# Patient Record
Sex: Male | Born: 1990 | Race: Black or African American | Hispanic: No | Marital: Single | Smoking: Never smoker
Health system: Southern US, Community
[De-identification: ages and names within clinical notes are randomized; demographics above are authoritative.]

---

## 2009-07-17 ENCOUNTER — Emergency Department (HOSPITAL_COMMUNITY): Admission: EM | Admit: 2009-07-17 | Discharge: 2009-07-17 | Payer: Self-pay | Admitting: Emergency Medicine

## 2009-11-10 ENCOUNTER — Emergency Department (HOSPITAL_COMMUNITY)
Admission: EM | Admit: 2009-11-10 | Discharge: 2009-11-10 | Payer: Self-pay | Source: Home / Self Care | Admitting: Emergency Medicine

## 2012-06-15 ENCOUNTER — Emergency Department (HOSPITAL_COMMUNITY)
Admission: EM | Admit: 2012-06-15 | Discharge: 2012-06-15 | Disposition: A | Discharge status: Home / Self Care | Payer: BC Managed Care – PPO | Attending: Emergency Medicine | Admitting: Emergency Medicine

## 2012-06-15 ENCOUNTER — Encounter (HOSPITAL_COMMUNITY): Payer: Self-pay | Admitting: Emergency Medicine

## 2012-06-15 DIAGNOSIS — R51 Headache: Secondary | ICD-10-CM | POA: Insufficient documentation

## 2012-06-15 DIAGNOSIS — R5383 Other fatigue: Secondary | ICD-10-CM | POA: Insufficient documentation

## 2012-06-15 DIAGNOSIS — B9789 Other viral agents as the cause of diseases classified elsewhere: Secondary | ICD-10-CM

## 2012-06-15 DIAGNOSIS — R110 Nausea: Secondary | ICD-10-CM | POA: Insufficient documentation

## 2012-06-15 DIAGNOSIS — IMO0001 Reserved for inherently not codable concepts without codable children: Secondary | ICD-10-CM | POA: Insufficient documentation

## 2012-06-15 DIAGNOSIS — R61 Generalized hyperhidrosis: Secondary | ICD-10-CM | POA: Insufficient documentation

## 2012-06-15 DIAGNOSIS — R509 Fever, unspecified: Secondary | ICD-10-CM | POA: Insufficient documentation

## 2012-06-15 DIAGNOSIS — R5381 Other malaise: Secondary | ICD-10-CM | POA: Insufficient documentation

## 2012-06-15 DIAGNOSIS — J029 Acute pharyngitis, unspecified: Secondary | ICD-10-CM | POA: Insufficient documentation

## 2012-06-15 DIAGNOSIS — J028 Acute pharyngitis due to other specified organisms: Secondary | ICD-10-CM

## 2012-06-15 DIAGNOSIS — R6889 Other general symptoms and signs: Secondary | ICD-10-CM

## 2012-06-15 LAB — RAPID STREP SCREEN (MED CTR MEBANE ONLY): Streptococcus, Group A Screen (Direct): NEGATIVE

## 2012-06-15 MED ORDER — ACETAMINOPHEN 500 MG PO TABS: 500.0000 mg | ORAL_TABLET | Freq: Four times a day (QID) | ORAL | Status: AC | PRN

## 2012-06-15 NOTE — ED Notes (Addendum)
C/O sore throat, fever, chills, headache--onset yesterday. Has taken Tylenol this am. Pt is diaphoretic.

## 2012-06-15 NOTE — ED Notes (Signed)
Pt c/o flu like sx with fever and chills starting yesterday; pt sts sore throat also

## 2012-06-15 NOTE — ED Provider Notes (Signed)
History     CSN: [id number]  Arrival date & time 06/15/12  7829   First MD Initiated Contact with Patient 06/15/12 [phone]      Chief Complaint  Patient presents with  . Influenza  . Generalized Body Aches    (Consider location/radiation/quality/duration/timing/severity/associated sxs/prior treatment) Patient is a 22 y.o. male presenting with flu symptoms. The history is provided by the patient. No language interpreter was used.  Influenza Presenting symptoms: fatigue, fever, headache, myalgias, nausea and sore throat   Presenting symptoms: no cough, no diarrhea, no rhinorrhea and no shortness of breath   Pt is a 21yo male presenting today with flu like symptoms c/o fatigue, fever, headache, sore throat, generalized body ache and nausea. Symptoms started yesterday.  Pt has tried Tylenol this morning.  Pt sore throat causes moderate pain with swallowing solids but able to keep down fluids.  Denies difficulty breathing.  Feels nauseous but denies vomiting or diarrhea.  Denies cough. No hx of asthma or immunocompromise.    History reviewed. No pertinent past medical history.  History reviewed. No pertinent past surgical history.  History reviewed. No pertinent family history.  History  Substance Use Topics  . Smoking status: Never Smoker   . Smokeless tobacco: Not on file  . Alcohol Use: Yes      Review of Systems  Constitutional: Positive for fever and fatigue.  HENT: Positive for sore throat. Negative for rhinorrhea.   Respiratory: Negative for cough and shortness of breath.   Gastrointestinal: Positive for nausea. Negative for diarrhea.  Musculoskeletal: Positive for myalgias.  Neurological: Positive for headaches.    Allergies  Review of patient's allergies indicates no known allergies.  Home Medications   Current Outpatient Rx  Name  Route  Sig  Dispense  Refill  . ibuprofen (ADVIL,MOTRIN) 200 MG tablet   Oral   Take 400 mg by mouth every 6 (six) hours as needed  for pain or headache.         Marland Kitchen acetaminophen (TYLENOL) 500 MG tablet   Oral   Take 1 tablet (500 mg total) by mouth every 6 (six) hours as needed for pain.   30 tablet   0     BP 144/76  Pulse 96  Temp(Src) 99.3 F (37.4 C) (Oral)  Resp 20  Ht 6\' 1"  (1.854 m)  Wt 230 lb (104.327 kg)  BMI 30.35 kg/m2  SpO2 97%  Physical Exam  Nursing note and vitals reviewed. Constitutional: He appears well-developed and well-nourished. No distress.  Pt lying on exam bed with face mask.  Pt feels warm to the touch.    HENT:  Head: Normocephalic and atraumatic. No trismus in the jaw.  Mouth/Throat: Uvula is midline and mucous membranes are normal. He does not have dentures. No oral lesions. Normal dentition. No dental abscesses, edematous, lacerations or dental caries. Oropharyngeal exudate, posterior oropharyngeal edema and posterior oropharyngeal erythema present. No tonsillar abscesses.  Eyes: Conjunctivae are normal.  Neck: Normal range of motion. Neck supple. No JVD present. No tracheal deviation present. No thyromegaly present.  Cardiovascular: Normal rate, regular rhythm and normal heart sounds.   Pulmonary/Chest: Effort normal and breath sounds normal. No stridor. No respiratory distress. He has no wheezes. He has no rales. He exhibits no tenderness.  Abdominal: Soft. Bowel sounds are normal. He exhibits no distension and no mass. There is no tenderness. There is no rebound and no guarding.  Musculoskeletal: Normal range of motion. He exhibits no edema and no tenderness.  Lymphadenopathy:  He has no cervical adenopathy.  Neurological: He is alert.  Skin: Skin is warm. No rash noted. He is diaphoretic. No erythema. No pallor.  Psychiatric: He has a normal mood and affect. His behavior is normal. Judgment and thought content normal.    ED Course  Procedures (including critical care time)  Labs Reviewed  RAPID STREP SCREEN   No results found.   1. Flu-like symptoms   2. Sore  throat (viral)       MDM  Pt c/o 1 day hx of flu like symptoms with sore throat, body aches, fever, chills, and nausea.  Able to keep down fluids.  No difficulty swallowing or breathing.  HENT: bilateral tonsillar erythema and hypertrophy with exudates but no peritonsillar abscess.   Rapid strep: neg.  Believe this is viral pharyngitis, possible flu.  Will tx symptomatically with acetaminophen.  Have pt rest and drink lots of fluids.  Will give work note.  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  Tamiflu is more beneficial for young or older immunocompromised pt's and may only help relieve sxs 1 day sooner but could cause added unwanted side effects.  Will have pt f/u with PCP or urgent care in a week if symptoms do not improve, or sooner if symptoms worsen.  Return to ED if trouble breathing or unable to keep down fluids.   Vitals: unremarkable. Discharged in stable condition.    Discussed pt with attending during ED encounter.       Junius Finner, PA-C 06/15/12 2149

## 2012-06-17 NOTE — ED Provider Notes (Signed)
Medical screening examination/treatment/procedure(s) were performed by non-physician practitioner and as supervising physician I was immediately available for consultation/collaboration.   Richardean Canal, MD 06/17/12 [phone]

## 2019-03-04 ENCOUNTER — Ambulatory Visit (HOSPITAL_COMMUNITY)
Admission: EM | Admit: 2019-03-04 | Discharge: 2019-03-04 | Disposition: A | Discharge status: Home / Self Care | Attending: Emergency Medicine | Admitting: Emergency Medicine

## 2019-03-04 ENCOUNTER — Other Ambulatory Visit: Payer: Self-pay

## 2019-03-04 ENCOUNTER — Encounter (HOSPITAL_COMMUNITY): Payer: Self-pay

## 2019-03-04 DIAGNOSIS — M545 Low back pain, unspecified: Secondary | ICD-10-CM

## 2019-03-04 MED ORDER — CYCLOBENZAPRINE HCL 5 MG PO TABS: 5.0000 mg | ORAL_TABLET | Freq: Two times a day (BID) | ORAL | 0 refills | Status: DC | PRN

## 2019-03-04 MED ORDER — PREDNISONE 10 MG (21) PO TBPK
ORAL_TABLET | Freq: Every day | ORAL | 0 refills | Status: AC
Start: 2019-03-04 — End: 2019-03-10

## 2019-03-04 NOTE — Discharge Instructions (Signed)
Back pain is most likely muscular straining Begin prednisone taper over the next 6 days, begin with 6 tablets on day 1, decrease by 1 tablet each day until completing.  Take with food and in the morning if you are able.  You may use flexeril as needed to help with pain. This is a muscle relaxer and causes sedation- please use only at bedtime or when you will be home and not have to drive/work  Alternate ice and heat to back Gentle stretching  Please follow-up if pain not improving, worsening, developing leg weakness, numbness or tingling, blood in urine or issues with urination/bowels

## 2019-03-04 NOTE — ED Triage Notes (Signed)
Pt states he has left side back pain. Pt the pain is a 10. Pt state this has been going on 3 days. Pt states he has tried Ibuprofen.

## 2019-03-06 NOTE — ED Provider Notes (Signed)
[facility]    CSN: [id number] Arrival date & time: 03/04/19  1354      History   Chief Complaint Chief Complaint  Patient presents with  . Back Pain    HPI Brian Brooks is a 29 y.o. male no significant past medical history presenting today for evaluation of back pain.  Patient states that for the past 3 days he has had pain in his left lower back.  States that on Monday night he sneezed and believes this is what triggered his symptoms.  He denies specific trauma or blow to his back.  Pain worsens with bending and sitting; has difficulty getting comfortable.  He has been taking Tylenol and ibuprofen without for relief.  He initially thought his symptoms were improving, but worsened again today.  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  He denies any changes in urination or bowel movements.  Denies dysuria.  Denies hematuria.  Denies history of prior kidney stones.  Denies any abdominal pain or any pain that radiates to his testicle.  Denies testicular swelling.  He denies prior history of any back pain.  HPI  History reviewed. No pertinent past medical history.  There are no problems to display for this patient.   History reviewed. No pertinent surgical history.     Home Medications    Prior to Admission medications   Medication Sig Start Date End Date Taking? Authorizing Provider  acetaminophen (TYLENOL) 500 MG tablet Take 1 tablet (500 mg total) by mouth every 6 (six) hours as needed for pain. 06/15/12   Lurene Shadow, PA-C  cyclobenzaprine (FLEXERIL) 5 MG tablet Take 1-2 tablets (5-10 mg total) by mouth 2 (two) times daily as needed for muscle spasms. 03/04/19   Lynanne Delgreco C, PA-C  ibuprofen (ADVIL,MOTRIN) 200 MG tablet Take 400 mg by mouth every 6 (six) hours as needed for pain or headache.    [provider]  predniSONE (STERAPRED UNI-PAK 21 TAB) 10 MG (21) TBPK tablet Take by mouth daily  for 6 days. Take as directed on package 03/04/19 03/10/19  Lew Dawes, PA-C    Family History History reviewed. No pertinent family history.  Social History Social History   Tobacco Use  . Smoking status: Never Smoker  . Smokeless tobacco: Never Used  Substance Use Topics  . Alcohol use: Yes  . Drug use: No     Allergies   Patient has no known allergies.   Review of Systems Review of Systems  Constitutional: Negative for fatigue and fever.  Eyes: Negative for redness, itching and visual disturbance.  Respiratory: Negative for shortness of breath.   Cardiovascular: Negative for chest pain and leg swelling.  Gastrointestinal: Negative for nausea and vomiting.  Genitourinary: Negative for decreased urine volume, difficulty urinating, dysuria, frequency and hematuria.  Musculoskeletal: Positive for back pain and myalgias. Negative for arthralgias.  Skin: Negative for color change, rash and wound.  Neurological: Negative for dizziness, syncope, weakness, light-headedness and headaches.     Physical Exam Triage Vital Signs ED Triage Vitals  Enc Vitals Group     BP 03/04/19 1446 (!) 144/72     Pulse Rate 03/04/19 1446 68     Resp 03/04/19 1446 18     Temp 03/04/19 1446 98.3 F (36.8 C)     Temp Source 03/04/19 1446 Oral     SpO2 03/04/19 1446 100 %     Weight  03/04/19 1444 260 lb (117.9 kg)     Height --      Head Circumference --      Peak Flow --      Pain Score 03/04/19 1444 10     Pain Loc --      Pain Edu? --      Excl. in [place]? --    No data found.  Updated Vital Signs BP (!) 144/72 (BP Location: Right Arm)   Pulse 68   Temp 98.3 F (36.8 C) (Oral)   Resp 18   Wt 260 lb (117.9 kg)   SpO2 100%   BMI 34.30 kg/m   Visual Acuity Right Eye Distance:   Left Eye Distance:   Bilateral Distance:    Right Eye Near:   Left Eye Near:    Bilateral Near:     Physical Exam Vitals and nursing note reviewed.  Constitutional:      Appearance: He is  well-developed.     Comments: No acute distress but does appear uncomfortable as he is changing positions frequently  HENT:     Head: Normocephalic and atraumatic.     Nose: Nose normal.  Eyes:     Conjunctiva/sclera: Conjunctivae normal.  Cardiovascular:     Rate and Rhythm: Normal rate.  Pulmonary:     Effort: Pulmonary effort is normal. No respiratory distress.  Abdominal:     General: There is no distension.  Musculoskeletal:        General: Normal range of motion.     Cervical back: Neck supple.     Comments: Nontender to palpation of cervical, thoracic or lumbar spine midline, tenderness to palpation of left superior lumbar area and delivered thoracic area  Strength 5/5 and equal bilaterally at hips and knees, patellar reflex 2+ bilaterally  Gait without abnormality  Skin:    General: Skin is warm and dry.  Neurological:     Mental Status: He is alert and oriented to person, place, and time.      UC Treatments / Results  Labs (all labs ordered are listed, but only abnormal results are displayed) Labs Reviewed - No data to display  EKG   Radiology No results found.  Procedures Procedures (including critical care time)  Medications Ordered in UC Medications - No data to display  Initial Impression / Assessment and Plan / UC Course  I have reviewed the triage vital signs and the nursing notes.  Pertinent labs & imaging results that were available during my care of the patient were reviewed by me and considered in my medical decision making (see chart for details).     Back pain most likely lumbar strain, no radicular distribution.  No urinary symptoms and pain triggered by movement, do not suspect kidney stone.  No mechanism of injury, do not suspect acute bony abnormality.  At this time recommending continuing anti-inflammatories and will add in muscle relaxers.  Will provide steroids as alternative to NSAIDs as he has not had much improvement with this.  Will  provide prednisone taper and Flexeril.  Gentle back stretching.  No red flags for cauda equina.  Continue to monitor,Discussed strict return precautions. Patient verbalized understanding and is agreeable with plan.  Final Clinical Impressions(s) / UC Diagnoses   Final diagnoses:  Acute left-sided low back pain without sciatica     Discharge Instructions     Back pain is most likely muscular straining Begin prednisone taper over the next 6 days, begin with 6 tablets on  day 1, decrease by 1 tablet each day until completing.  Take with food and in the morning if you are able.  You may use flexeril as needed to help with pain. This is a muscle relaxer and causes sedation- please use only at bedtime or when you will be home and not have to drive/work  Alternate ice and heat to back Gentle stretching  Please follow-up if pain not improving, worsening, developing leg weakness, numbness or tingling, blood in urine or issues with urination/bowels   ED Prescriptions    Medication Sig Dispense Auth. Provider   predniSONE (STERAPRED UNI-PAK 21 TAB) 10 MG (21) TBPK tablet Take by mouth daily for 6 days. Take as directed on package 21 tablet Tarrah Furuta C, PA-C   cyclobenzaprine (FLEXERIL) 5 MG tablet Take 1-2 tablets (5-10 mg total) by mouth 2 (two) times daily as needed for muscle spasms. 24 tablet Henderson Frampton, Caroline C, PA-C     I have reviewed the PDMP during this encounter.   Lew Dawes, PA-C 03/06/19 1308

## 2019-03-08 ENCOUNTER — Emergency Department (HOSPITAL_COMMUNITY)
Admission: EM | Admit: 2019-03-08 | Discharge: 2019-03-09 | Disposition: A | Discharge status: Home / Self Care | Attending: Emergency Medicine | Admitting: Emergency Medicine

## 2019-03-08 ENCOUNTER — Other Ambulatory Visit: Payer: Self-pay

## 2019-03-08 ENCOUNTER — Encounter (HOSPITAL_COMMUNITY): Payer: Self-pay | Admitting: Emergency Medicine

## 2019-03-08 DIAGNOSIS — R1032 Left lower quadrant pain: Secondary | ICD-10-CM | POA: Insufficient documentation

## 2019-03-08 DIAGNOSIS — R109 Unspecified abdominal pain: Secondary | ICD-10-CM | POA: Diagnosis present

## 2019-03-08 DIAGNOSIS — R1012 Left upper quadrant pain: Secondary | ICD-10-CM | POA: Insufficient documentation

## 2019-03-08 LAB — COMPREHENSIVE METABOLIC PANEL
ALT: 29 U/L (ref 0–44)
AST: 14 U/L — ABNORMAL LOW (ref 15–41)
Albumin: 4.3 g/dL (ref 3.5–5.0)
Alkaline Phosphatase: 43 U/L (ref 38–126)
Anion gap: 7 (ref 5–15)
BUN: 17 mg/dL (ref 6–20)
CO2: 30 mmol/L (ref 22–32)
Calcium: 9.5 mg/dL (ref 8.9–10.3)
Chloride: 104 mmol/L (ref 98–111)
Creatinine, Ser: 1.19 mg/dL (ref 0.61–1.24)
GFR calc Af Amer: >60 mL/min (ref >60)
GFR calc non Af Amer: >60 mL/min (ref >60)
Glucose, Bld: 122 mg/dL — ABNORMAL HIGH (ref 70–99)
Potassium: 4.6 mmol/L (ref 3.5–5.1)
Sodium: 141 mmol/L (ref 135–145)
Total Bilirubin: 0.6 mg/dL (ref 0.3–1.2)
Total Protein: 7 g/dL (ref 6.5–8.1)

## 2019-03-08 LAB — CBC
HCT: 45.9 % (ref 39.0–52.0)
Hemoglobin: 14.7 g/dL (ref 13.0–17.0)
MCH: 25.8 pg — ABNORMAL LOW (ref 26.0–34.0)
MCHC: 32 g/dL (ref 30.0–36.0)
MCV: 80.7 fL (ref 80.0–100.0)
Platelets: 222 10*3/uL (ref 150–400)
RBC: 5.69 MIL/uL (ref 4.22–5.81)
RDW: 13.8 % (ref 11.5–15.5)
WBC: 6.9 10*3/uL (ref 4.0–10.5)
nRBC: 0 % (ref 0.0–0.2)

## 2019-03-08 LAB — URINALYSIS, ROUTINE W REFLEX MICROSCOPIC
Bilirubin Urine: NEGATIVE
Glucose, UA: NEGATIVE mg/dL
Hgb urine dipstick: NEGATIVE
Ketones, ur: NEGATIVE mg/dL
Leukocytes,Ua: NEGATIVE
Nitrite: NEGATIVE
Protein, ur: NEGATIVE mg/dL
Specific Gravity, Urine: 1.019 (ref 1.005–1.030)
pH: 6 (ref 5.0–8.0)

## 2019-03-08 LAB — LIPASE, BLOOD: Lipase: 27 U/L (ref 11–51)

## 2019-03-08 NOTE — ED Triage Notes (Signed)
Patient reports lower back pain x3 days ago. States left side abdominal pain that worsens after eating x2 days. Denies N/V/D and urinary sx.

## 2019-03-09 ENCOUNTER — Encounter (HOSPITAL_BASED_OUTPATIENT_CLINIC_OR_DEPARTMENT_OTHER): Payer: Self-pay

## 2019-03-09 ENCOUNTER — Emergency Department (HOSPITAL_BASED_OUTPATIENT_CLINIC_OR_DEPARTMENT_OTHER)

## 2019-03-09 ENCOUNTER — Other Ambulatory Visit: Payer: Self-pay

## 2019-03-09 ENCOUNTER — Emergency Department (HOSPITAL_BASED_OUTPATIENT_CLINIC_OR_DEPARTMENT_OTHER)
Admission: EM | Admit: 2019-03-09 | Discharge: 2019-03-09 | Disposition: A | Discharge status: Home / Self Care | Source: Home / Self Care | Attending: Emergency Medicine | Admitting: Emergency Medicine

## 2019-03-09 DIAGNOSIS — R109 Unspecified abdominal pain: Secondary | ICD-10-CM

## 2019-03-09 LAB — COMPREHENSIVE METABOLIC PANEL
ALT: 27 U/L (ref 0–44)
AST: 14 U/L — ABNORMAL LOW (ref 15–41)
Albumin: 4.6 g/dL (ref 3.5–5.0)
Alkaline Phosphatase: 46 U/L (ref 38–126)
Anion gap: 8 (ref 5–15)
BUN: 14 mg/dL (ref 6–20)
CO2: 29 mmol/L (ref 22–32)
Calcium: 9.4 mg/dL (ref 8.9–10.3)
Chloride: 102 mmol/L (ref 98–111)
Creatinine, Ser: 1 mg/dL (ref 0.61–1.24)
GFR calc Af Amer: >60 mL/min (ref >60)
GFR calc non Af Amer: >60 mL/min (ref >60)
Glucose, Bld: 98 mg/dL (ref 70–99)
Potassium: 4.1 mmol/L (ref 3.5–5.1)
Sodium: 139 mmol/L (ref 135–145)
Total Bilirubin: 0.4 mg/dL (ref 0.3–1.2)
Total Protein: 7.6 g/dL (ref 6.5–8.1)

## 2019-03-09 LAB — CBC
HCT: 48 % (ref 39.0–52.0)
Hemoglobin: 15.5 g/dL (ref 13.0–17.0)
MCH: 25.9 pg — ABNORMAL LOW (ref 26.0–34.0)
MCHC: 32.3 g/dL (ref 30.0–36.0)
MCV: 80.1 fL (ref 80.0–100.0)
Platelets: 220 10*3/uL (ref 150–400)
RBC: 5.99 MIL/uL — ABNORMAL HIGH (ref 4.22–5.81)
RDW: 13.8 % (ref 11.5–15.5)
WBC: 7.6 10*3/uL (ref 4.0–10.5)
nRBC: 0 % (ref 0.0–0.2)

## 2019-03-09 LAB — LIPASE, BLOOD: Lipase: 24 U/L (ref 11–51)

## 2019-03-09 LAB — URINALYSIS, ROUTINE W REFLEX MICROSCOPIC
Bilirubin Urine: NEGATIVE
Glucose, UA: NEGATIVE mg/dL
Hgb urine dipstick: NEGATIVE
Ketones, ur: NEGATIVE mg/dL
Leukocytes,Ua: NEGATIVE
Nitrite: NEGATIVE
Protein, ur: NEGATIVE mg/dL
Specific Gravity, Urine: 1.02 (ref 1.005–1.030)
pH: 7 (ref 5.0–8.0)

## 2019-03-09 MED ORDER — METHOCARBAMOL 500 MG PO TABS: 500.0000 mg | ORAL_TABLET | Freq: Two times a day (BID) | ORAL | 0 refills | Status: DC

## 2019-03-09 MED ORDER — IOHEXOL 300 MG/ML  SOLN
100.0000 mL | Freq: Once | INTRAMUSCULAR | Status: AC | PRN
  Administered 2019-03-09: 21:00:00 100 mL via INTRAVENOUS

## 2019-03-09 MED ORDER — SODIUM CHLORIDE 0.9% FLUSH
3.0000 mL | Freq: Once | INTRAVENOUS | Status: DC
  Filled 2019-03-09: qty 3

## 2019-03-09 NOTE — ED Notes (Signed)
EDP Plunkett advised of case-no new orders at this time

## 2019-03-09 NOTE — ED Triage Notes (Signed)
Pt c/o abd pain x 5 days-worse after eating and when in seated position-also c/o lower back pain x 1 week-states he saw chiropractor yesterday and this am-pt [facility] ED yesterday with same c/o-NAD-steady gait

## 2019-03-09 NOTE — ED Provider Notes (Signed)
[facility]ENCY DEPARTMENT Provider Note   CSN: [id number] Arrival date & time: 03/09/19  1338     History Chief Complaint  Patient presents with  . Abdominal Pain    Brian Brooks is a 29 y.o. male who presents with abdominal pain.  Patient states that it started about 4-5 days ago.  The pain is intermittent.  It is over the left side of the abdomen.  Feels like a tight ball.  It is moderate in nature.  He first noticed it when he sat down to eat.  It seems to be worse when he sits and better when he stands or walks.  He denies fever, chills, chest pain, shortness of breath, nausea, vomiting, diarrhea, constipation, urinary symptoms.  Has never had this pain before.  He did have a left lower back strain last week and went to see a Land.  He states the back pain is improving and does not radiate around to the abdomen.  HPI     History reviewed. No pertinent past medical history.  There are no problems to display for this patient.   History reviewed. No pertinent surgical history.     No family history on file.  Social History   Tobacco Use  . Smoking status: Never Smoker  . Smokeless tobacco: Never Used  Substance Use Topics  . Alcohol use: Yes    Comment: occ  . Drug use: No    Home Medications Prior to Admission medications   Medication Sig Start Date End Date Taking? Authorizing Provider  acetaminophen (TYLENOL) 500 MG tablet Take 1 tablet (500 mg total) by mouth every 6 (six) hours as needed for pain. 06/15/12   Lurene Shadow, PA-C  cyclobenzaprine (FLEXERIL) 5 MG tablet Take 1-2 tablets (5-10 mg total) by mouth 2 (two) times daily as needed for muscle spasms. 03/04/19   Wieters, Hallie C, PA-C  ibuprofen (ADVIL,MOTRIN) 200 MG tablet Take 400 mg by mouth every 6 (six) hours as needed for pain or headache.    [provider]  methocarbamol (ROBAXIN) 500 MG tablet Take 1 tablet (500 mg total) by mouth 2 (two) times daily. 03/09/19    Bethel Born, PA-C  predniSONE (STERAPRED UNI-PAK 21 TAB) 10 MG (21) TBPK tablet Take by mouth daily for 6 days. Take as directed on package 03/04/19 03/10/19  Lew Dawes, PA-C    Allergies    Patient has no known allergies.  Review of Systems   Review of Systems  Constitutional: Negative for chills and fever.  Respiratory: Negative for shortness of breath.   Cardiovascular: Negative for chest pain.  Gastrointestinal: Positive for abdominal pain. Negative for diarrhea, nausea and vomiting.  Genitourinary: Negative for dysuria and flank pain.  Musculoskeletal: Positive for back pain.  All other systems reviewed and are negative.   Physical Exam Updated Vital Signs BP (!) 163/97 (BP Location: Left Arm)   Pulse 65   Temp 98 F (36.7 C) (Oral)   Resp 16   SpO2 100%   Physical Exam Vitals and nursing note reviewed.  Constitutional:      General: He is not in acute distress.    Appearance: He is well-developed. He is not ill-appearing.  HENT:     Head: Normocephalic and atraumatic.  Eyes:     General: No scleral icterus.       Right eye: No discharge.        Left eye: No discharge.     Conjunctiva/sclera: Conjunctivae normal.  Pupils: Pupils are equal, round, and reactive to light.  Cardiovascular:     Rate and Rhythm: Normal rate and regular rhythm.  Pulmonary:     Effort: Pulmonary effort is normal. No respiratory distress.     Breath sounds: Normal breath sounds.  Abdominal:     General: There is no distension.     Palpations: Abdomen is soft.     Tenderness: There is abdominal tenderness (mild tenderness over the left abdomen).  Musculoskeletal:     Cervical back: Normal range of motion.     Comments: No back tenderness  Skin:    General: Skin is warm and dry.  Neurological:     Mental Status: He is alert and oriented to person, place, and time.  Psychiatric:        Behavior: Behavior normal.     ED Results / Procedures / Treatments    Labs (all labs ordered are listed, but only abnormal results are displayed) Labs Reviewed  COMPREHENSIVE METABOLIC PANEL - Abnormal; Notable for the following components:      Result Value   AST 14 (*)    All other components within normal limits  CBC - Abnormal; Notable for the following components:   RBC 5.99 (*)    MCH 25.9 (*)    All other components within normal limits  URINALYSIS, ROUTINE W REFLEX MICROSCOPIC - Abnormal; Notable for the following components:   APPearance CLOUDY (*)    All other components within normal limits  LIPASE, BLOOD    EKG None  Radiology CT Abdomen Pelvis W Contrast  Result Date: 03/09/2019 CLINICAL DATA:  Left-sided abdominal pain x4 days. EXAM: CT ABDOMEN AND PELVIS WITH CONTRAST TECHNIQUE: Multidetector CT imaging of the abdomen and pelvis was performed using the standard protocol following bolus administration of intravenous contrast. CONTRAST:  100 mL of Omnipaque 300 was administered. COMPARISON:  None. FINDINGS: Lower chest: There is a small left-sided pleural effusion. There is a trace right-sided pleural effusion.The heart size is normal. Hepatobiliary: The liver is normal. Normal gallbladder.There is no biliary ductal dilation. Pancreas: Normal contours without ductal dilatation. No peripancreatic fluid collection. Spleen: No splenic laceration or hematoma. Adrenals/Urinary Tract: --Adrenal glands: No adrenal hemorrhage. --Right kidney/ureter: No hydronephrosis or perinephric hematoma. --Left kidney/ureter: No hydronephrosis or perinephric hematoma. --Urinary bladder: Unremarkable. Stomach/Bowel: --Stomach/Duodenum: No hiatal hernia or other gastric abnormality. Normal duodenal course and caliber. --Small bowel: No dilatation or inflammation. --Colon: No focal abnormality. --Appendix: Normal. Vascular/Lymphatic: Normal course and caliber of the major abdominal vessels. --No retroperitoneal lymphadenopathy. --No mesenteric lymphadenopathy. --No pelvic  or inguinal lymphadenopathy. Reproductive: Unremarkable Other: No ascites or free air. The abdominal wall is normal. Musculoskeletal. No acute displaced fractures. IMPRESSION: 1. No acute abdominopelvic abnormality. 2. Small bilateral pleural effusions, left greater than right. Electronically Signed   By: Katherine Mantle M.D.   On: 03/09/2019 21:14    Procedures Procedures (including critical care time)  Medications Ordered in ED Medications  iohexol (OMNIPAQUE) 300 MG/ML solution 100 mL (100 mLs Intravenous Contrast Given 03/09/19 2108)    ED Course  I have reviewed the triage vital signs and the nursing notes.  Pertinent labs & imaging results that were available during my care of the patient were reviewed by me and considered in my medical decision making (see chart for details).  29 year old male presents with left-sided abdominal pain for 4 to 5 days.  It is intermittent and worse with sitting.  Blood pressure here is elevated but otherwise vital signs are  normal.  He is tender over the left abdomen in the upper and lower quadrants.  There is no flank or back tenderness. Labs are reassuring and essentially unchanged from yesterday. Shared decision making was made with pt. Will obtain CT A/P  CT is negative. Discussed with patient. I wonder if pain is MSK etiology from his recent back injury. Rx for muscle relaxer given. Advised to establish care with PCP.  MDM Rules/Calculators/A&P                      Final Clinical Impression(s) / ED Diagnoses Final diagnoses:  Abdominal pain, unspecified abdominal location    Rx / DC Orders ED Discharge Orders         Ordered    methocarbamol (ROBAXIN) 500 MG tablet  2 times daily     03/09/19 2142           Recardo Evangelist, PA-C 03/12/19 7672    Gareth Morgan, MD 03/17/19 1434

## 2019-03-09 NOTE — Discharge Instructions (Signed)
Try Robaxin for muscle tightness or pain Try a heating pad on the area or a topical cream on the area Please follow up with a primary doctor

## 2019-03-09 NOTE — ED Notes (Signed)
Case discussed with EDP Schlossman-orders for abd pain protocol

## 2021-01-07 ENCOUNTER — Encounter (HOSPITAL_COMMUNITY): Payer: Self-pay | Admitting: Emergency Medicine

## 2021-01-07 ENCOUNTER — Emergency Department (HOSPITAL_COMMUNITY)
Admission: EM | Admit: 2021-01-07 | Discharge: 2021-01-08 | Disposition: A | Discharge status: Home / Self Care | Attending: Emergency Medicine | Admitting: Emergency Medicine

## 2021-01-07 ENCOUNTER — Other Ambulatory Visit: Payer: Self-pay

## 2021-01-07 ENCOUNTER — Emergency Department (HOSPITAL_COMMUNITY)

## 2021-01-07 DIAGNOSIS — G510 Bell's palsy: Secondary | ICD-10-CM | POA: Insufficient documentation

## 2021-01-07 DIAGNOSIS — R200 Anesthesia of skin: Secondary | ICD-10-CM | POA: Diagnosis present

## 2021-01-07 LAB — CBC
HCT: 46.6 % (ref 39.0–52.0)
Hemoglobin: 15 g/dL (ref 13.0–17.0)
MCH: 25.7 pg — ABNORMAL LOW (ref 26.0–34.0)
MCHC: 32.2 g/dL (ref 30.0–36.0)
MCV: 79.9 fL — ABNORMAL LOW (ref 80.0–100.0)
Platelets: 227 10*3/uL (ref 150–400)
RBC: 5.83 MIL/uL — ABNORMAL HIGH (ref 4.22–5.81)
RDW: 14.5 % (ref 11.5–15.5)
WBC: 6.6 10*3/uL (ref 4.0–10.5)
nRBC: 0 % (ref 0.0–0.2)

## 2021-01-07 LAB — I-STAT CHEM 8, ED
BUN: 12 mg/dL (ref 6–20)
Calcium, Ion: 1.22 mmol/L (ref 1.15–1.40)
Chloride: 105 mmol/L (ref 98–111)
Creatinine, Ser: 1.3 mg/dL — ABNORMAL HIGH (ref 0.61–1.24)
Glucose, Bld: 122 mg/dL — ABNORMAL HIGH (ref 70–99)
HCT: 47 % (ref 39.0–52.0)
Hemoglobin: 16 g/dL (ref 13.0–17.0)
Potassium: 4.1 mmol/L (ref 3.5–5.1)
Sodium: 141 mmol/L (ref 135–145)
TCO2: 27 mmol/L (ref 22–32)

## 2021-01-07 LAB — CBG MONITORING, ED: Glucose-Capillary: 121 mg/dL — ABNORMAL HIGH (ref 70–99)

## 2021-01-07 LAB — COMPREHENSIVE METABOLIC PANEL
ALT: 24 U/L (ref 0–44)
AST: 19 U/L (ref 15–41)
Albumin: 3.9 g/dL (ref 3.5–5.0)
Alkaline Phosphatase: 59 U/L (ref 38–126)
Anion gap: 8 (ref 5–15)
BUN: 10 mg/dL (ref 6–20)
CO2: 25 mmol/L (ref 22–32)
Calcium: 9.6 mg/dL (ref 8.9–10.3)
Chloride: 106 mmol/L (ref 98–111)
Creatinine, Ser: 1.27 mg/dL — ABNORMAL HIGH (ref 0.61–1.24)
GFR, Estimated: >60 mL/min (ref >60)
Glucose, Bld: 126 mg/dL — ABNORMAL HIGH (ref 70–99)
Potassium: 4.1 mmol/L (ref 3.5–5.1)
Sodium: 139 mmol/L (ref 135–145)
Total Bilirubin: 0.7 mg/dL (ref 0.3–1.2)
Total Protein: 6.9 g/dL (ref 6.5–8.1)

## 2021-01-07 LAB — APTT: aPTT: 27 seconds (ref 24–36)

## 2021-01-07 LAB — DIFFERENTIAL
Abs Immature Granulocytes: 0.04 10*3/uL (ref 0.00–0.07)
Basophils Absolute: 0 10*3/uL (ref 0.0–0.1)
Basophils Relative: 0 %
Eosinophils Absolute: 0.1 10*3/uL (ref 0.0–0.5)
Eosinophils Relative: 1 %
Immature Granulocytes: 1 %
Lymphocytes Relative: 43 %
Lymphs Abs: 2.8 10*3/uL (ref 0.7–4.0)
Monocytes Absolute: 0.5 10*3/uL (ref 0.1–1.0)
Monocytes Relative: 7 %
Neutro Abs: 3.2 10*3/uL (ref 1.7–7.7)
Neutrophils Relative %: 48 %

## 2021-01-07 LAB — PROTIME-INR
INR: 1 (ref 0.8–1.2)
Prothrombin Time: 13.5 seconds (ref 11.4–15.2)

## 2021-01-07 MED ORDER — SODIUM CHLORIDE 0.9% FLUSH
3.0000 mL | Freq: Once | INTRAVENOUS | Status: AC
  Administered 2021-01-07: 3 mL via INTRAVENOUS

## 2021-01-07 NOTE — ED Provider Notes (Signed)
[name] DEPART[facility] Provider Note   CSN: [id number] Arrival date & time: 01/07/21  2248     History Chief Complaint  Patient presents with   numbness right side of face    Brian Brooks is a 30 y.o. male without significant pmhx who presents to the ED with complaints of right sided facial numbness since 9AM today. Patient reports that he is having numbness to the right cheek, lip, mouth, and chin with facial droop. Has been persistent throughout the day, subsequently started to notice he could not close his right eye as effectively. He called the [facility] who directed him to the ED for further assessment. No alleviating/aggravating factors. Denies numbness/weakness to any other location, change in vision, slurred speech, dizziness, headache, ear pain, or rashes.   HPI     History reviewed. No pertinent past medical history.  There are no problems to display for this patient.   History reviewed. No pertinent surgical history.     History reviewed. No pertinent family history.  Social History   Tobacco Use   Smoking status: Never   Smokeless tobacco: Never  Vaping Use   Vaping Use: Never used  Substance Use Topics   Alcohol use: Yes    Comment: occ   Drug use: No    Home Medications Prior to Admission medications   Medication Sig Start Date End Date Taking? Authorizing Provider  acetaminophen (TYLENOL) 500 MG tablet Take 1 tablet (500 mg total) by mouth every 6 (six) hours as needed for pain. 06/15/12   Noe Gens, PA-C  cyclobenzaprine (FLEXERIL) 5 MG tablet Take 1-2 tablets (5-10 mg total) by mouth 2 (two) times daily as needed for muscle spasms. 03/04/19   Wieters, Hallie C, PA-C  ibuprofen (ADVIL,MOTRIN) 200 MG tablet Take 400 mg by mouth every 6 (six) hours as needed for pain or headache.    [provider]  methocarbamol (ROBAXIN) 500 MG tablet Take 1 tablet (500 mg total) by mouth 2 (two) times daily. 03/09/19   Recardo Evangelist, PA-C    Allergies    Patient has no known allergies.  Review of Systems   Review of Systems  Constitutional:  Negative for chills and fever.  HENT:  Negative for congestion, ear pain and sore throat.   Eyes:  Negative for visual disturbance.  Respiratory:  Negative for shortness of breath.   Cardiovascular:  Negative for chest pain.  Gastrointestinal:  Negative for diarrhea and vomiting.  Neurological:  Positive for facial asymmetry and numbness. Negative for syncope and speech difficulty.  All other systems reviewed and are negative.  Physical Exam Updated Vital Signs BP (!) 144/92   Pulse 76   Temp 99 F (37.2 C) (Oral)   Resp (!) 24   Ht 6\' 1"  (1.854 m)   Wt 122.5 kg   SpO2 99%   BMI 35.62 kg/m   Physical Exam Vitals and nursing note reviewed.  Constitutional:      General: He is not in acute distress.    Appearance: Normal appearance. He is not toxic-appearing.  HENT:     Head: Normocephalic and atraumatic.     Ears:     Comments: No vesicles/rash present. Some cerumen in EAC bilaterally- non-obstructing.     Mouth/Throat:     Pharynx: Oropharynx is clear. Uvula midline.  Eyes:     General: Vision grossly intact. Gaze aligned appropriately.     Extraocular Movements: Extraocular movements intact.     Conjunctiva/sclera:  Conjunctivae normal.     Pupils: Pupils are equal, round, and reactive to light.     Comments: No proptosis.   Cardiovascular:     Rate and Rhythm: Normal rate and regular rhythm.  Pulmonary:     Effort: Pulmonary effort is normal.     Breath sounds: Normal breath sounds.  Abdominal:     General: There is no distension.     Palpations: Abdomen is soft.     Tenderness: There is no abdominal tenderness. There is no guarding or rebound.  Musculoskeletal:     Cervical back: Normal range of motion and neck supple. No rigidity.  Skin:    General: Skin is warm and dry.  Neurological:     Mental Status: He is alert.     Comments: Alert.  Clear speech. Right sided facial droop with forehead involvement and difficulty closing the ipsilateral eye. Bilateral upper and lower extremities' sensation grossly intact. 5/5 symmetric strength with grip strength and with plantar and dorsi flexion bilaterally . Normal finger to nose bilaterally. Negative pronator drift. Gait intact.    Psychiatric:        Mood and Affect: Mood normal.        Behavior: Behavior normal.    ED Results / Procedures / Treatments   Labs (all labs ordered are listed, but only abnormal results are displayed) Labs Reviewed  CBC - Abnormal; Notable for the following components:      Result Value   RBC 5.83 (*)    MCV 79.9 (*)    MCH 25.7 (*)    All other components within normal limits  COMPREHENSIVE METABOLIC PANEL - Abnormal; Notable for the following components:   Glucose, Bld 126 (*)    Creatinine, Ser 1.27 (*)    All other components within normal limits  CBG MONITORING, ED - Abnormal; Notable for the following components:   Glucose-Capillary 121 (*)    All other components within normal limits  I-STAT CHEM 8, ED - Abnormal; Notable for the following components:   Creatinine, Ser 1.30 (*)    Glucose, Bld 122 (*)    All other components within normal limits  PROTIME-INR  APTT  DIFFERENTIAL    EKG EKG Interpretation  Date/Time:  Sunday January 07 2021 23:01:24 EST Ventricular Rate:  80 PR Interval:  132 QRS Duration: 74 QT Interval:  362 QTC Calculation: 417 R Axis:   73 Text Interpretation: Normal sinus rhythm T wave abnormality, consider inferior ischemia Abnormal ECG No previous tracing Confirmed by Orpah Greek [phone]) on 01/08/2021 12:04:11 AM  Radiology CT HEAD WO CONTRAST  Result Date: 01/07/2021 CLINICAL DATA:  Right-sided facial numbness. Neuro deficit, acute, stroke suspected EXAM: CT HEAD WITHOUT CONTRAST TECHNIQUE: Contiguous axial images were obtained from the base of the skull through the vertex without intravenous  contrast. COMPARISON:  None. FINDINGS: Brain: There is a prominent perivascular space in the right basal ganglia. No intracranial hemorrhage, mass effect, or midline shift. No hydrocephalus. The basilar cisterns are patent. No evidence of territorial infarct or acute ischemia. No extra-axial or intracranial fluid collection. Vascular: No hyperdense vessel or unexpected calcification. Skull: No fracture or focal lesion. Sinuses/Orbits: Paranasal sinuses and mastoid air cells are clear. The visualized orbits are unremarkable. Other: None. IMPRESSION: Negative noncontrast head CT. Electronically Signed   By: Keith Rake M.D.   On: 01/07/2021 23:32    Procedures Procedures   Medications Ordered in ED Medications  sodium chloride flush (NS) 0.9 % injection 3 mL (  3 mLs Intravenous Given 01/07/21 2343)    ED Course  I have reviewed the triage vital signs and the nursing notes.  Pertinent labs & imaging results that were available during my care of the patient were reviewed by me and considered in my medical decision making (see chart for details).    MDM Rules/Calculators/A&P                           Patient presents to the emergency department with right sided facial droop, H&P appear consistent with Bell's palsy. Patient nontoxic appearing, resting comfortably, vitals w/ elevated BP- doubt HTN emergency. On exam patient appears to have right sided facial droop with forehead involvement and difficulty closing the ipsilateral eye, there are no other focal neurologic deficits, no extremity motor/sensory deficits, negative pronator drift, therefore doubt acute CVA.   Work up per triage has been reviewed and is grossly unremarkable.  CT head normal, no acute intracranial abnormalities.  Labs without leukocytosis, anemia, or significant electrolyte derangement, mild creatinine elevation.  EKG without obvious ischemia. I have reviewed & interpreted labs & imaging.   Will treat for Bell's palsy with  with prednisone as well as valacyclovir (1000 mg TID for 7 days) . Recommended artificial tears and keeping the affected eye taped closed when sleeping at night to preserve moisture.  Close PCP follow-up with possible follow-ups with neurology and ophthalmology as needed, information for specialists provided. I discussed results, treatment plan, need for PCP follow-up, and return precautions with the patient. Provided opportunity for questions, patient confirmed understanding and is in agreement with plan.     Findings and plan of care discussed with supervising physician Dr. Blinda Leatherwood who personally evaluated and examined this patient and is in agreement with plan.    Final Clinical Impression(s) / ED Diagnoses Final diagnoses:  Bell's palsy    Rx / DC Orders ED Discharge Orders          Ordered    predniSONE (DELTASONE) 20 MG tablet  Daily with breakfast        01/08/21 0036    valACYclovir (VALTREX) 1000 MG tablet  3 times daily        01/08/21 0036             Alia Parsley, Pleas Koch, PA-C 01/08/21 0039    Gilda Crease, MD 01/08/21 0107

## 2021-01-07 NOTE — ED Provider Notes (Signed)
Emergency Medicine Provider Triage Evaluation Note  Brian Brooks , a 30 y.o. male  was evaluated in triage.  Pt complains of right sided facial numbness extending from bottom of right eye to chin, with facial droop not involving forehead. LNW 9am. No weakness, dizziness, aphasia, dysarthria otherwise. No hx HTN, DM, CAD. No injury prior to symptoms starting. No headache.  Review of Systems  Positive: As above Negative: As above  Physical Exam  BP (!) 165/108 (BP Location: Right Arm)   Pulse 89   Temp 99 F (37.2 C) (Oral)   Resp 15   Ht 6\' 1"  (1.854 m)   Wt 122.5 kg   SpO2 99%   BMI 35.62 kg/m  Gen:   Awake, no distress   Resp:  Normal effort  MSK:   Moves extremities without difficulty  Other:  Right sided facial droop not involving forehead, subjective numbness of maxillary and mandibular branches trigeminal nerve, Cns otherwise intact, PERRL, EOM intact. Strength 5/5 bil UE/LE, no pronator drift, romberg negative, gait normal, intact finger to nose.  Medical Decision Making  Medically screening exam initiated at 11:06 PM.  Appropriate orders placed.  Simcha Oscar was informed that the remainder of the evaluation will be completed by another provider, this initial triage assessment does not replace that evaluation, and the importance of remaining in the ED until their evaluation is complete.  Acute neuro deficit, stroke suspected VAN negative, outside stroke window Dr. Earlene Plater notified, stroke orders placed without code stroke activated   Dalene Seltzer 01/07/21 2309    2310, MD 01/08/21 2242

## 2021-01-07 NOTE — ED Triage Notes (Addendum)
Per pt he began to experience right sided facial numbness at 9:30am today.  He is struggling to close his right eye and there is a droop however his wife says the might be normal.    Pt does not appear to have any other neuro deficits at this time.    Provider bedside

## 2021-01-08 ENCOUNTER — Encounter (HOSPITAL_COMMUNITY): Payer: Self-pay | Admitting: Student

## 2021-01-08 MED ORDER — VALACYCLOVIR HCL 1 G PO TABS: 1000.0000 mg | ORAL_TABLET | Freq: Three times a day (TID) | ORAL | 0 refills | Status: AC

## 2021-01-08 MED ORDER — PREDNISONE 20 MG PO TABS: 60.0000 mg | ORAL_TABLET | Freq: Every day | ORAL | 0 refills | Status: AC

## 2021-01-08 NOTE — ED Notes (Signed)
E-signature pad unavailable at time of pt discharge. This RN discussed discharge materials with pt and answered all pt questions. Pt stated understanding of discharge material. ? ?

## 2021-01-08 NOTE — Discharge Instructions (Addendum)
You were seen in the emergency department today for facial droop, it appears that you have Bell's palsy- please see the attached handout for further information regarding this diagnosis.  We are treating this with two prescription medications:  - Prednisone, a steroid, take this daily for the next 5 days.  - Valtrex, an antiviral medicine, take this 3 times per day for the next 7 days.  We have prescribed you new medication(s) today. Discuss the medications prescribed today with your pharmacist as they can have adverse effects and interactions with your other medicines including over the counter and prescribed medications. Seek medical evaluation if you start to experience new or abnormal symptoms after taking one of these medicines, seek care immediately if you start to experience difficulty breathing, feeling of your throat closing, facial swelling, or rash as these could be indications of a more serious allergic reaction   Please tape your right eye closed when sleeping at night. When awake please use over-the-counter artificial tears, ask your pharmacist about these, use these 6 times per day in order to ensure that your eye remains moist.     We would like you to follow-up closely with your primary care provider within 3 days for reevaluation. It may be necessary for you to follow-up with an eye specialist and/or a neurologist, we have given you information for each of these types of specialists, discuss with your primary care provider to see if they feel this is necessary based on your reevaluation. Return to the ER for new or worsening symptoms including but not limited to numbness or weakness to your arms or legs, change in your vision, fever, problems with hearing or ear pain, or any other concerns that you may have.   Your blood pressure and your creatinine (lab looks at kidney function) were elevated in the ER, have these rechecked by your primary care provider.

## 2021-01-24 ENCOUNTER — Ambulatory Visit (INDEPENDENT_AMBULATORY_CARE_PROVIDER_SITE_OTHER): Admitting: Neurology

## 2021-01-24 ENCOUNTER — Other Ambulatory Visit: Payer: Self-pay

## 2021-01-24 ENCOUNTER — Encounter: Payer: Self-pay | Admitting: Neurology

## 2021-01-24 VITALS — BP 127/84 | HR 69 | Ht 73.0 in | Wt 247.0 lb

## 2021-01-24 DIAGNOSIS — G510 Bell's palsy: Secondary | ICD-10-CM | POA: Diagnosis not present

## 2021-01-24 NOTE — Patient Instructions (Signed)
Continue with current medications  Follow up with your PMD

## 2021-01-24 NOTE — Progress Notes (Signed)
[facility]  PATIENT: Brian Brooks DOB: 1990/10/29  REQUESTING CLINICIAN: No ref. provider found HISTORY FROM: Patient  REASON FOR VISIT: Right facial weakness   HISTORICAL  CHIEF COMPLAINT:  Chief Complaint  Patient presents with   New Patient (Initial Visit)    Rm 13. Alone. NP/internal ED referral for Bell's palsy. Pt would like to know possible causes of Bell's palsy. Pt states he goes to the [facility] in [place] for primary care.    HISTORY OF PRESENT ILLNESS:  This is a 30 year old gentleman with no past medical history who is presenting for right-sided Bell's palsy.  Patient reported that he woke up on Sunday morning and noted that his right face was numb, then he noted when brushing his teeth, he was drooling on the right side and he couldn't close his right eye properly, he got concerned about a stroke and presented to the ED.  In the ED have a head CT which was negative for acute stroke, symptoms and objective findings was consistent with Bell's palsy.  He was discharged with Valacyclovir and prednisone.  Since being discharged patient reports some improvement on the weakness on the right side, he is able to close his right eye better and drooling on the right side of his mouth as stopped.     OTHER MEDICAL CONDITIONS: No past medical history    REVIEW OF SYSTEMS: Full 14 system review of systems performed and negative with exception of: as noted in the HPI  ALLERGIES: No Known Allergies  HOME MEDICATIONS: Outpatient Medications Prior to Visit  Medication Sig Dispense Refill   acetaminophen (TYLENOL) 500 MG tablet Take 1 tablet (500 mg total) by mouth every 6 (six) hours as needed for pain. 30 tablet 0   amitriptyline (ELAVIL) 50 MG tablet TAKE ONE TABLET BY MOUTH AT BEDTIME AS NEEDED FOR INSOMNIA AND HEADACHE PREVENTION     ibuprofen (ADVIL,MOTRIN) 200 MG tablet Take 400 mg by mouth every 6 (six) hours as needed for pain or headache.      cyclobenzaprine (FLEXERIL) 5 MG tablet Take 1-2 tablets (5-10 mg total) by mouth 2 (two) times daily as needed for muscle spasms. 24 tablet 0   methocarbamol (ROBAXIN) 500 MG tablet Take 1 tablet (500 mg total) by mouth 2 (two) times daily. 20 tablet 0   No facility-administered medications prior to visit.    PAST MEDICAL HISTORY: History reviewed. No pertinent past medical history.  PAST SURGICAL HISTORY: History reviewed. No pertinent surgical history.  FAMILY HISTORY: History reviewed. No pertinent family history.  SOCIAL HISTORY: Social History   Socioeconomic History   Marital status: Single    Spouse name: Not on file   Number of children: Not on file   Years of education: Not on file   Highest education level: Not on file  Occupational History   Not on file  Tobacco Use   Smoking status: Never   Smokeless tobacco: Never  Vaping Use   Vaping Use: Never used  Substance and Sexual Activity   Alcohol use: Yes    Comment: occ   Drug use: No   Sexual activity: Not on file  Other Topics Concern   Not on file  Social History Narrative   Not on file   Social Determinants of Health   Financial Resource Strain: Not on file  Food Insecurity: Not on file  Transportation Needs: Not on file  Physical Activity: Not on file  Stress: Not on file  Social Connections: Not on  file  Intimate Partner Violence: Not on file     PHYSICAL EXAM  GENERAL EXAM/CONSTITUTIONAL: Vitals:  Vitals:   01/24/21 1331  BP: 127/84  Pulse: 69  Weight: 247 lb (112 kg)  Height: 6\' 1"  (1.854 m)   Body mass index is 32.59 kg/m. Wt Readings from Last 3 Encounters:  01/24/21 247 lb (112 kg)  01/07/21 270 lb (122.5 kg)  03/04/19 260 lb (117.9 kg)   Patient is in no distress; well developed, nourished and groomed; neck is supple  EYES: Pupils round and reactive to light, Visual fields full to confrontation, Extraocular movements intacts,   MUSCULOSKELETAL: Gait, strength, tone,  movements noted in Neurologic exam below  NEUROLOGIC: MENTAL STATUS:  No flowsheet data found. awake, alert, oriented to person, place and time recent and remote memory intact normal attention and concentration language fluent, comprehension intact, naming intact fund of knowledge appropriate  CRANIAL NERVE:  2nd, 3rd, 4th, 6th - pupils equal and reactive to light, visual fields full to confrontation, extraocular muscles intact, no nystagmus 5th - facial sensation symmetric 7th - There is right facial weakness, upper and lower face. 8th - hearing intact 9th - palate elevates symmetrically, uvula midline 11th - shoulder shrug symmetric 12th - tongue protrusion midline  MOTOR:  normal bulk and tone, full strength in the BUE, BLE  SENSORY:  normal and symmetric to light touch, pinprick, temperature, vibration  COORDINATION:  finger-nose-finger, fine finger movements normal  REFLEXES:  deep tendon reflexes present and symmetric  GAIT/STATION:  normal    DIAGNOSTIC DATA (LABS, IMAGING, TESTING) - I reviewed patient records, labs, notes, testing and imaging myself where available.  Lab Results  Component Value Date   WBC 6.6 01/07/2021   HGB 16.0 01/07/2021   HCT 47.0 01/07/2021   MCV 79.9 (L) 01/07/2021   PLT 227 01/07/2021      Component Value Date/Time   NA 141 01/07/2021 2337   K 4.1 01/07/2021 2337   CL 105 01/07/2021 2337   CO2 25 01/07/2021 2306   GLUCOSE 122 (H) 01/07/2021 2337   BUN 12 01/07/2021 2337   CREATININE 1.30 (H) 01/07/2021 2337   CALCIUM 9.6 01/07/2021 2306   PROT 6.9 01/07/2021 2306   ALBUMIN 3.9 01/07/2021 2306   AST 19 01/07/2021 2306   ALT 24 01/07/2021 2306   ALKPHOS 59 01/07/2021 2306   BILITOT 0.7 01/07/2021 2306   GFRNONAA >60 01/07/2021 2306   GFRAA >60 03/09/2019 1917   No results found for: CHOL, HDL, LDLCALC, LDLDIRECT, TRIG, CHOLHDL No results found for: 05/07/2019 No results found for: VITAMINB12 No results found for:  TSH  Head CT Negative noncontrast head CT    ASSESSMENT AND PLAN  30 y.o. year old male with no past medical history presented with right facial weakness consistent with Bell's palsy.  He has been treated with valacyclovir and prednisone and reports improvement of his symptoms. At this point, we will continue current medication and no have him follow-up with his primary care doctor.   1. Bell's palsy     PLAN: Continue with current medications  Follow up with your PMD   No orders of the defined types were placed in this encounter.   No orders of the defined types were placed in this encounter.   Return if symptoms worsen or fail to improve.    34, MD 01/24/2021, 2:14 PM  [facility] [address] [place], [place] [phone]

## 2021-02-22 ENCOUNTER — Telehealth: Payer: Self-pay | Admitting: Neurology

## 2021-02-22 NOTE — Telephone Encounter (Signed)
Pt called, need letter for [employer] for disability. Would like a call from the nurse.  Pt did not wish to elaborate, preferred to discuss with physician.

## 2021-02-22 NOTE — Telephone Encounter (Signed)
He wanted a letter for his current disability stating his Bell's Palsy was caused by his stress (r/t PTSD). Says he "googled this information". Unfortunately, we are unable to provide this type of letter. He verbalized understanding and would a like his records.

## 2021-06-10 IMAGING — CT CT ABD-PELV W/ CM
2 of 4 series · 16 of 46 positions shown, 18 images · IV contrast (Omnipaque)
Comparison: None.

CLINICAL DATA: Left-sided abdominal pain x4 days.

EXAM:
CT ABDOMEN AND PELVIS WITH CONTRAST
TECHNIQUE: Multidetector CT imaging of the abdomen and pelvis was performed
using the standard protocol following bolus administration of
intravenous contrast.
CONTRAST:  100 mL of Omnipaque 300 was administered.

[Series 2: axial st · axial · 0.79mm/px · z∈[+572,+1038]mm · 13 of 101 slices shown, 15 images]
[im 4/101  soft-tissue]
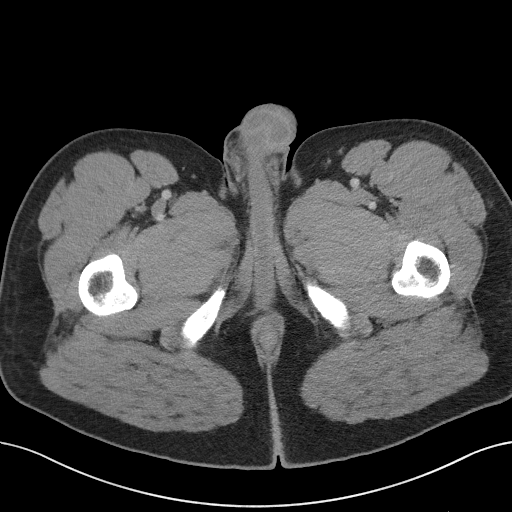
[im 4/101  bone]
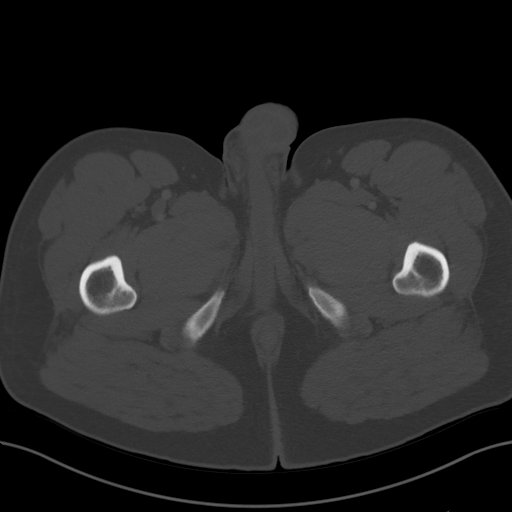
[im 12/101  soft-tissue]
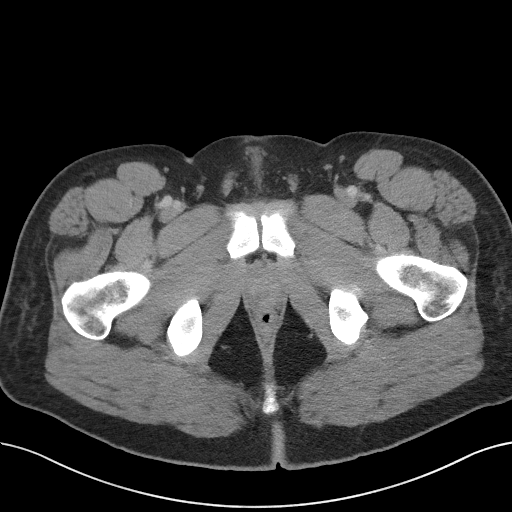
[im 20/101  soft-tissue]
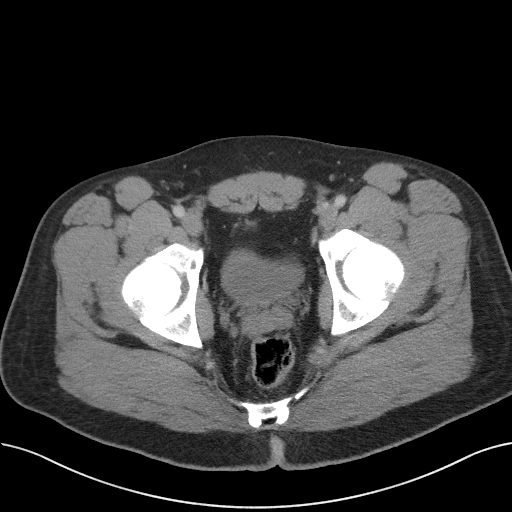
[im 27/101  soft-tissue]
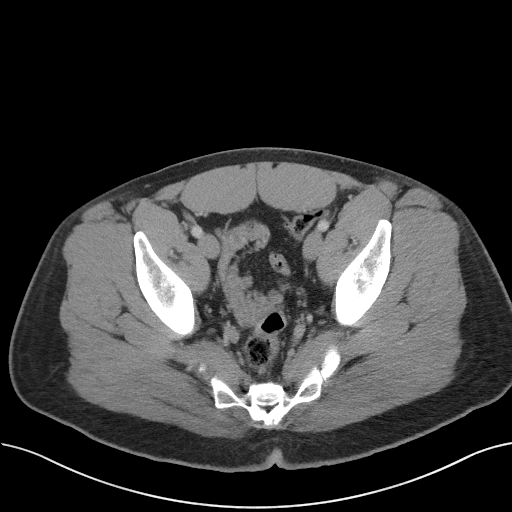
[im 35/101  soft-tissue]
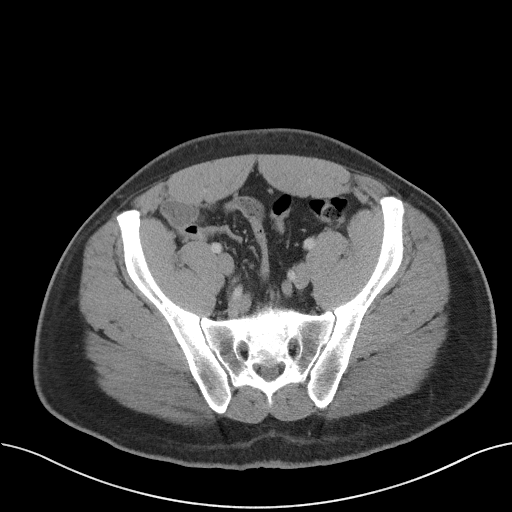
[im 43/101  soft-tissue]
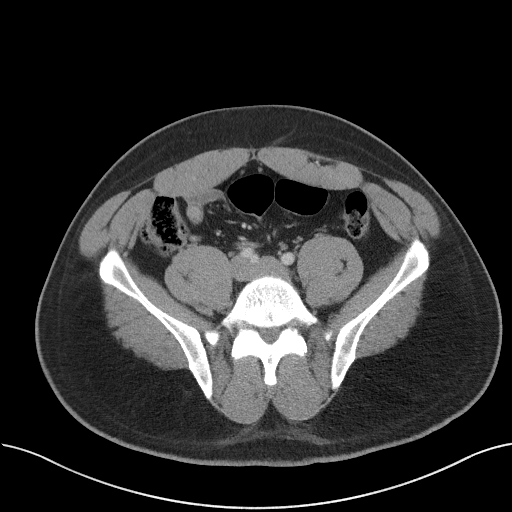
[im 51/101  soft-tissue]
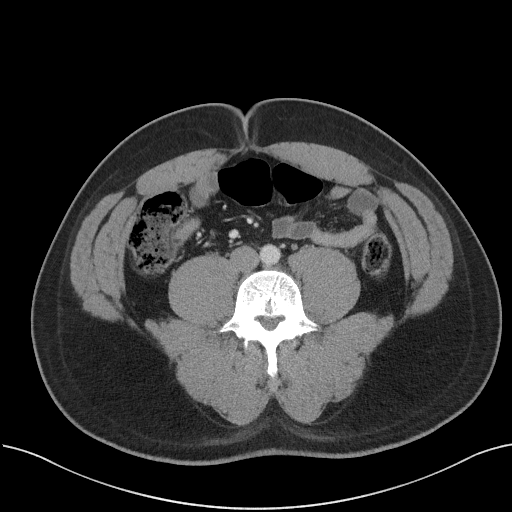
[im 58/101  soft-tissue]
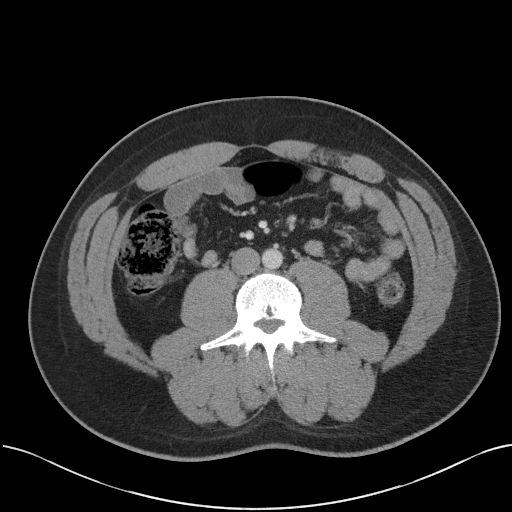
[im 66/101  soft-tissue]
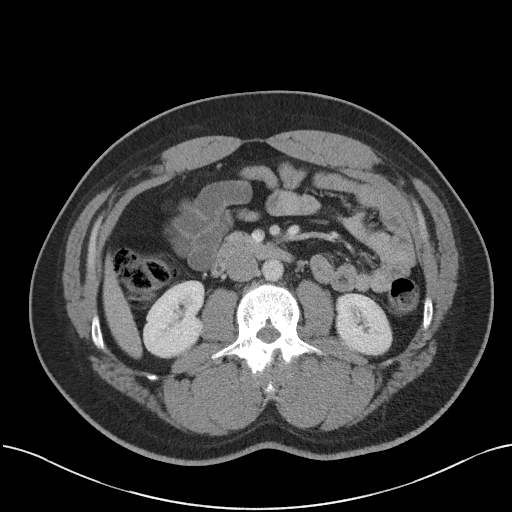
[im 66/101  bone]
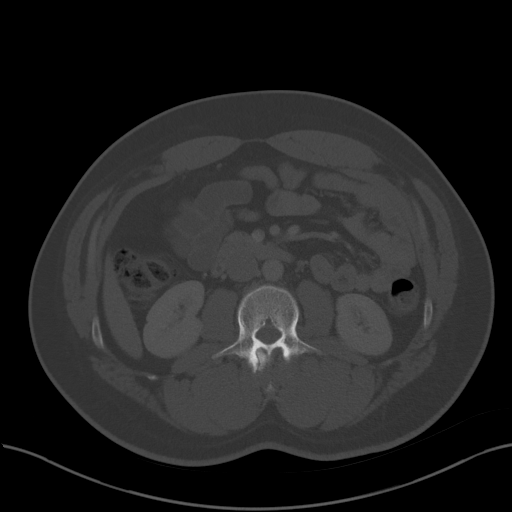
[im 74/101  soft-tissue]
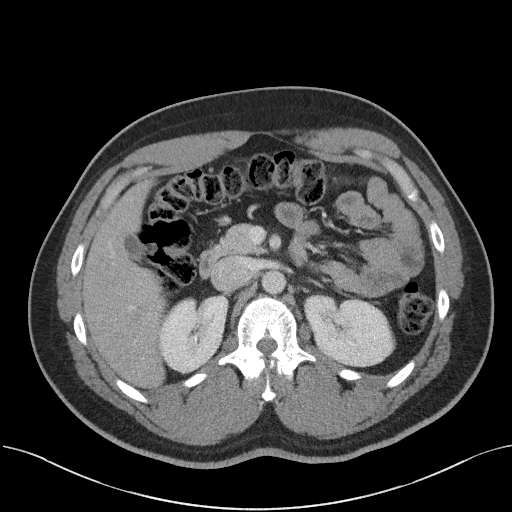
[im 81/101  soft-tissue]
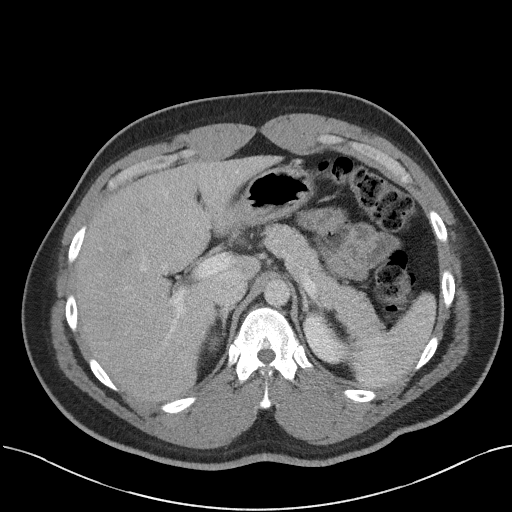
[im 89/101  soft-tissue]
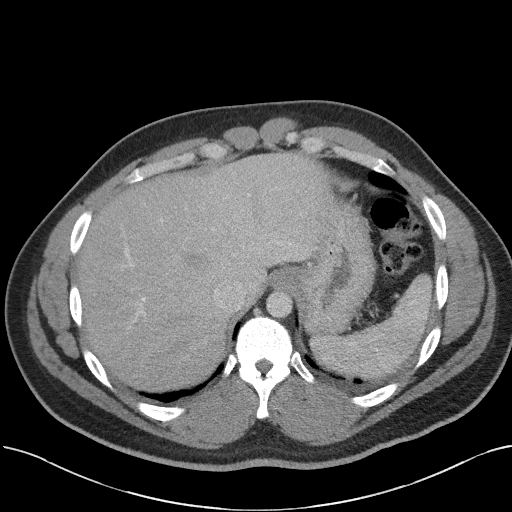
[im 97/101  soft-tissue]
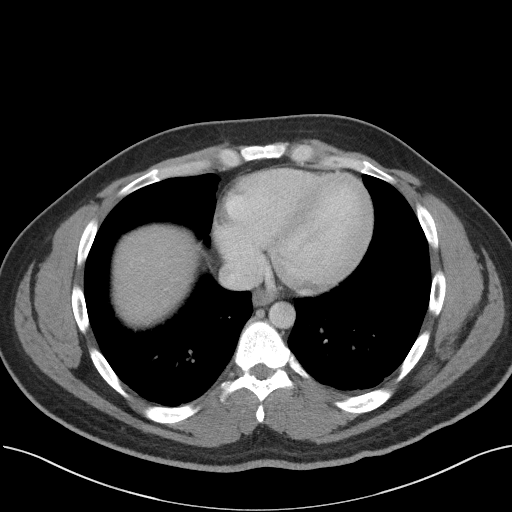

[Series 5: coronal st · coronal · 0.77mm/px · 3 of 101 slices shown]
[im 34/101  soft-tissue]
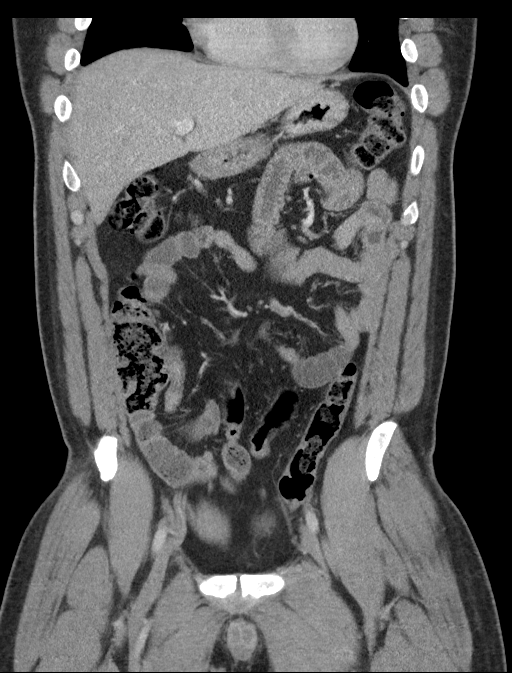
[im 45/101  soft-tissue]
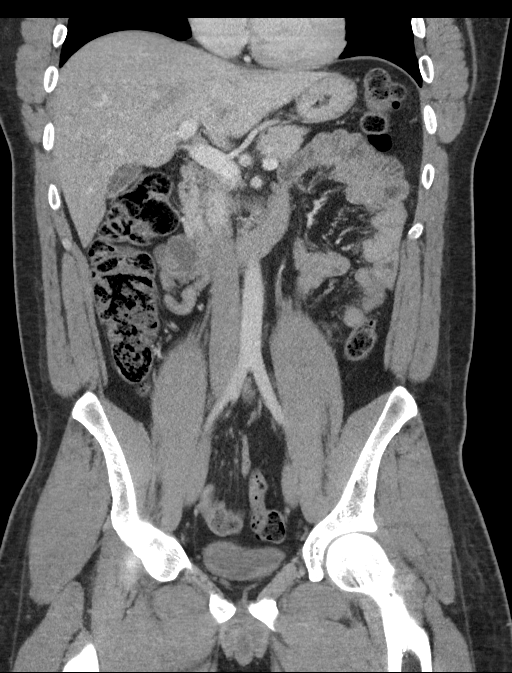
[im 56/101  soft-tissue]
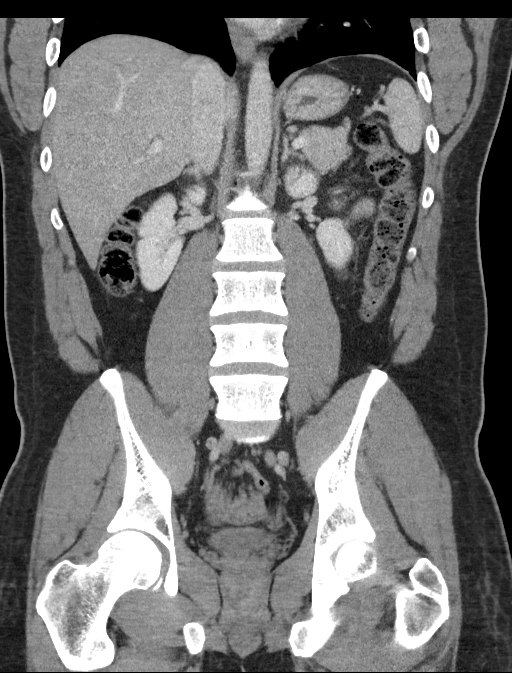

[16 of 46 positions shown; findings below may reference images not displayed]

FINDINGS: Lower chest: There is a small left-sided pleural effusion. There is
a trace right-sided pleural effusion.The heart size is normal.

Hepatobiliary: The liver is normal. Normal gallbladder.There is no
biliary ductal dilation.

Pancreas: Normal contours without ductal dilatation. No
peripancreatic fluid collection.

Spleen: No splenic laceration or hematoma.

Adrenals/Urinary Tract:

--Adrenal glands: No adrenal hemorrhage.

--Right kidney/ureter: No hydronephrosis or perinephric hematoma.

--Left kidney/ureter: No hydronephrosis or perinephric hematoma.

--Urinary bladder: Unremarkable.

Stomach/Bowel:

--Stomach/Duodenum: No hiatal hernia or other gastric abnormality.
Normal duodenal course and caliber.

--Small bowel: No dilatation or inflammation.

--Colon: No focal abnormality.

--Appendix: Normal.

Vascular/Lymphatic: Normal course and caliber of the major abdominal
vessels.

--No retroperitoneal lymphadenopathy.

--No mesenteric lymphadenopathy.

--No pelvic or inguinal lymphadenopathy.

Reproductive: Unremarkable

Other: No ascites or free air. The abdominal wall is normal.

Musculoskeletal. No acute displaced fractures.
IMPRESSION: 1. No acute abdominopelvic abnormality.
2. Small bilateral pleural effusions, left greater than right.

## 2023-01-08 ENCOUNTER — Encounter (HOSPITAL_COMMUNITY): Payer: Self-pay

## 2023-01-08 ENCOUNTER — Ambulatory Visit (HOSPITAL_COMMUNITY)
Admission: EM | Admit: 2023-01-08 | Discharge: 2023-01-08 | Disposition: A | Discharge status: Home / Self Care | Attending: Emergency Medicine | Admitting: Emergency Medicine

## 2023-01-08 DIAGNOSIS — L0291 Cutaneous abscess, unspecified: Secondary | ICD-10-CM | POA: Diagnosis not present

## 2023-01-08 MED ORDER — SULFAMETHOXAZOLE-TRIMETHOPRIM 800-160 MG PO TABS
1.0000 | ORAL_TABLET | Freq: Two times a day (BID) | ORAL | 0 refills | Status: AC
Start: 2023-01-08 — End: 2023-01-15

## 2023-01-08 NOTE — Discharge Instructions (Signed)
Use warm compresses to the area 2-3x daily for 10-15 minutes at a time.  Afterwards you can pat dry and apply topical antibacterial ointment like Neosporin.  Take all antibiotics as prescribed and until finished, you can take them with food to help with gastrointestinal upset.  Keep the area covered throughout the day.  It should improve over the next 72 hours on antibiotics, if no improvement or any changes please return to clinic.

## 2023-01-08 NOTE — ED Provider Notes (Signed)
[facility]    CSN: [id number] Arrival date & time: 01/08/23  1931      History   Chief Complaint Chief Complaint  Patient presents with   Insect Bite    HPI Brian Brooks is a 32 y.o. male.   Patient presents to clinic over a potential bug bite to the back of his neck.  He noticed it on Thursday and it got bigger on Friday.  He did have a nurse at work look at it and they thought it might be a spider bite and that he might need antibiotics.  He has been doing warm compresses to the area.  It has been draining some bloody drainage.  Denies any previous history of abscesses, no witnessed spider bites or bug bites.  No fevers.  No nausea or vomiting.  The history is provided by the patient and medical records.    History reviewed. No pertinent past medical history.  There are no problems to display for this patient.   History reviewed. No pertinent surgical history.     Home Medications    Prior to Admission medications   Medication Sig Start Date End Date Taking? Authorizing Provider  sulfamethoxazole-trimethoprim (BACTRIM DS) 800-160 MG tablet Take 1 tablet by mouth 2 (two) times daily for 7 days. 01/08/23 01/15/23 Yes Rinaldo Ratel, [place], FNP  acetaminophen (TYLENOL) 500 MG tablet Take 1 tablet (500 mg total) by mouth every 6 (six) hours as needed for pain. 06/15/12   Lurene Shadow, PA-C  amitriptyline (ELAVIL) 50 MG tablet TAKE ONE TABLET BY MOUTH AT BEDTIME AS NEEDED FOR INSOMNIA AND HEADACHE PREVENTION 12/07/20   [provider]  ibuprofen (ADVIL,MOTRIN) 200 MG tablet Take 400 mg by mouth every 6 (six) hours as needed for pain or headache.    [provider]    Family History History reviewed. No pertinent family history.  Social History Social History   Tobacco Use   Smoking status: Never   Smokeless tobacco: Never  Vaping Use   Vaping status: Never Used  Substance Use Topics   Alcohol use: Yes    Comment: occ   Drug use: No      Allergies   Patient has no known allergies.   Review of Systems Review of Systems   Physical Exam Triage Vital Signs ED Triage Vitals [01/08/23 1940]  Encounter Vitals Group     BP 129/83     Systolic BP Percentile      Diastolic BP Percentile      Pulse Rate 74     Resp 16     Temp 98.3 F (36.8 C)     Temp Source Oral     SpO2 94 %     Weight      Height      Head Circumference      Peak Flow      Pain Score      Pain Loc      Pain Education      Exclude from Growth Chart    No data found.  Updated Vital Signs BP 129/83 (BP Location: Left Arm)   Pulse 74   Temp 98.3 F (36.8 C) (Oral)   Resp 16   SpO2 94%   Visual Acuity Right Eye Distance:   Left Eye Distance:   Bilateral Distance:    Right Eye Near:   Left Eye Near:    Bilateral Near:     Physical Exam Vitals and nursing note reviewed.  Constitutional:      Appearance: Normal appearance.  HENT:     Head: Normocephalic and atraumatic.     Right Ear: External ear normal.     Left Ear: External ear normal.     Nose: Nose normal.     Mouth/Throat:     Mouth: Mucous membranes are moist.  Eyes:     Conjunctiva/sclera: Conjunctivae normal.  Cardiovascular:     Rate and Rhythm: Normal rate.  Pulmonary:     Effort: Pulmonary effort is normal. No respiratory distress.  Musculoskeletal:        General: Normal range of motion.  Skin:    General: Skin is warm and dry.     Findings: Abscess present.          Comments: Abscess to the back of his neck noted draining serosanguineous drainage.  Area is is not fluctuant or indurated.  Neurological:     General: No focal deficit present.     Mental Status: He is alert.  Psychiatric:        Mood and Affect: Mood normal.        Behavior: Behavior is cooperative.      UC Treatments / Results  Labs (all labs ordered are listed, but only abnormal results are displayed) Labs Reviewed - No data to display  EKG   Radiology No results  found.  Procedures Procedures (including critical care time)  Medications Ordered in UC Medications - No data to display  Initial Impression / Assessment and Plan / UC Course  I have reviewed the triage vital signs and the nursing notes.  Pertinent labs & imaging results that were available during my care of the patient were reviewed by me and considered in my medical decision making (see chart for details).  Vitals and triage reviewed, patient is hemodynamically stable.  Has a draining abscess to the back of his neck, could be from a bug bite.  Area is not fluctuant or indurated, no need for incision and drainage at this time.  Will place on Bactrim for MRSA coverage.  Symptomatic management for wound care discussed.  Plan of care, follow-up care return precautions given, no questions at this time.     Final Clinical Impressions(s) / UC Diagnoses   Final diagnoses:  Abscess     Discharge Instructions      Use warm compresses to the area 2-3x daily for 10-15 minutes at a time.  Afterwards you can pat dry and apply topical antibacterial ointment like Neosporin.  Take all antibiotics as prescribed and until finished, you can take them with food to help with gastrointestinal upset.  Keep the area covered throughout the day.  It should improve over the next 72 hours on antibiotics, if no improvement or any changes please return to clinic.    ED Prescriptions     Medication Sig Dispense Auth. Provider   sulfamethoxazole-trimethoprim (BACTRIM DS) 800-160 MG tablet Take 1 tablet by mouth 2 (two) times daily for 7 days. 14 tablet Britne Borelli, [place], [place]      PDMP not reviewed this encounter.   Danecia Underdown, [place] N, [place] 01/08/23 2022

## 2023-01-08 NOTE — ED Triage Notes (Signed)
Pt reports insect bite on his neck x 3 days

## 2023-04-11 IMAGING — CT CT HEAD W/O CM
4 series · 16 of 47 positions shown, 18 images · non-contrast
Comparison: None.

CLINICAL DATA: Right-sided facial numbness. Neuro deficit, acute,
stroke suspected

EXAM:
CT HEAD WITHOUT CONTRAST
TECHNIQUE: Contiguous axial images were obtained from the base of the skull
through the vertex without intravenous contrast.

[Series 3: head wo · axial · 0.43mm/px · z∈[-113,+7]mm · 7 of 32 slices shown, 9 images]
[im 4/32  brain]
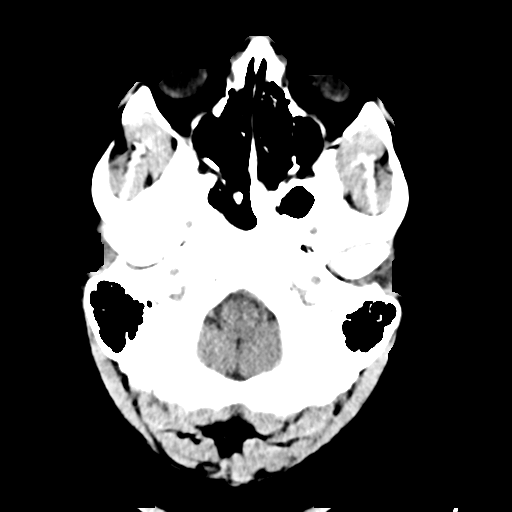
[im 4/32  bone]
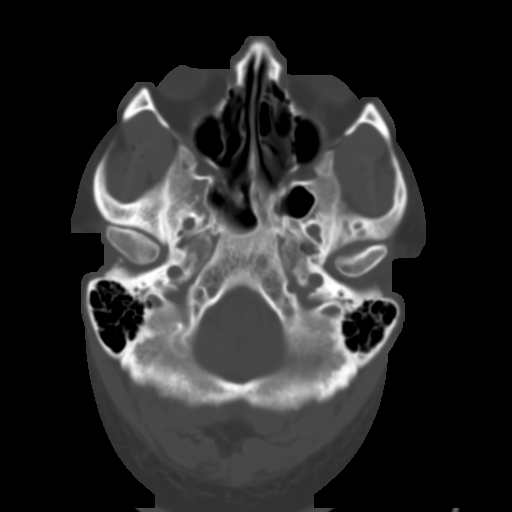
[im 8/32  brain]
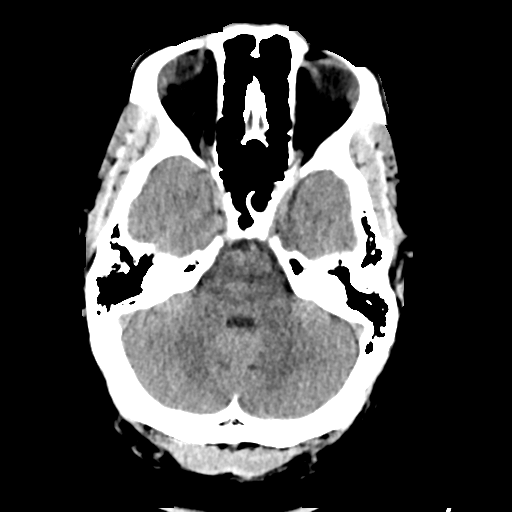
[im 12/32  brain]
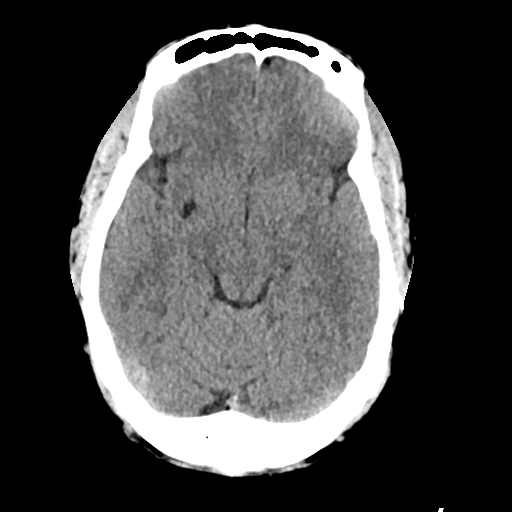
[im 16/32  brain]
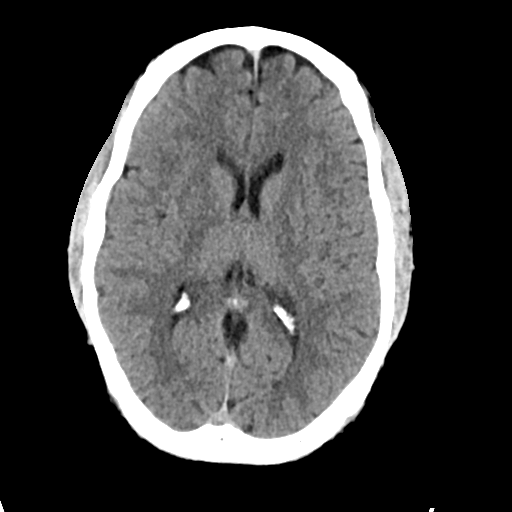
[im 20/32  brain]
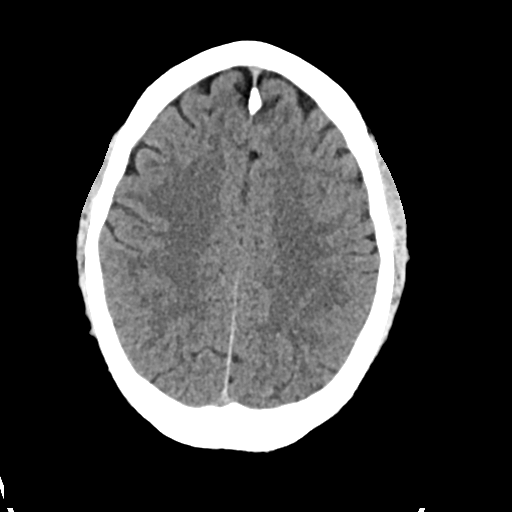
[im 20/32  bone]
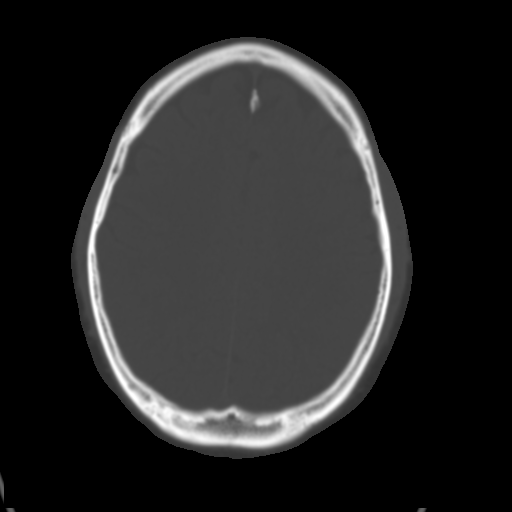
[im 24/32  brain]
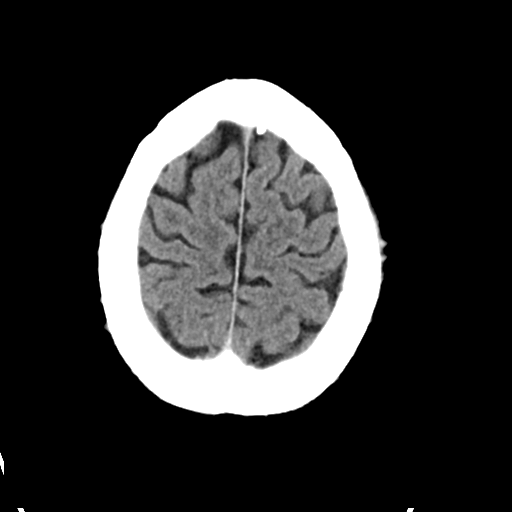
[im 28/32  brain]
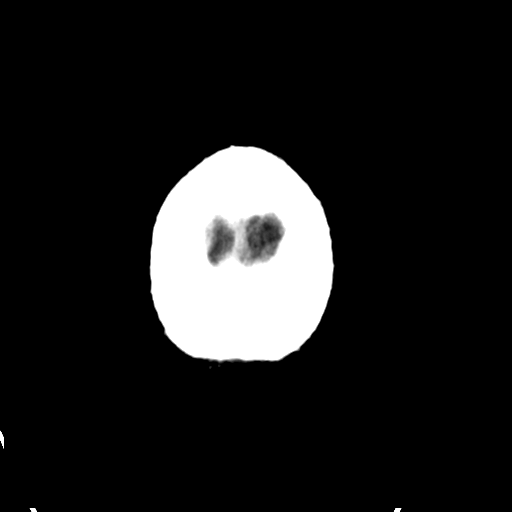

[Series 4: head bone · axial · 0.43mm/px · z∈[-114,-82]mm · 3 of 79 slices shown]
[im 8/79  bone]
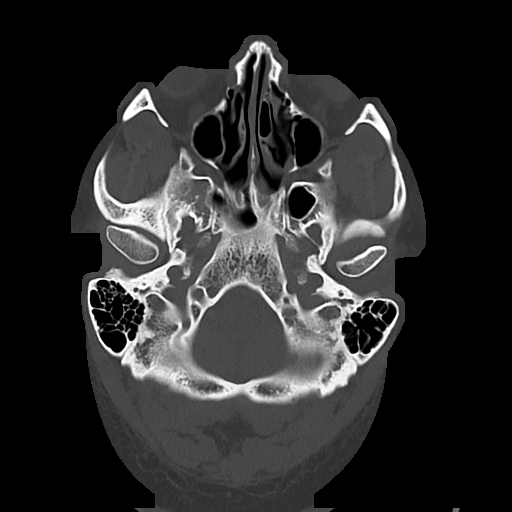
[im 16/79  bone]
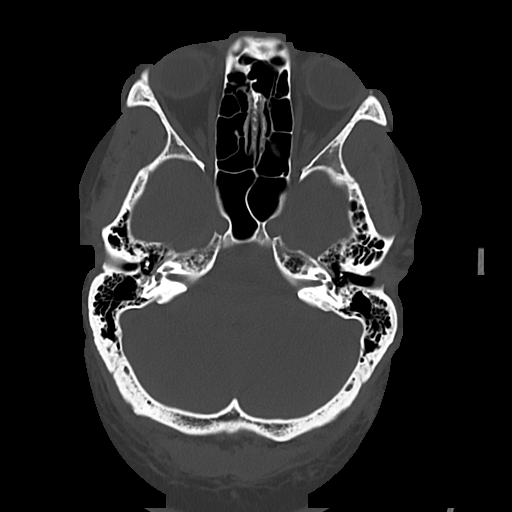
[im 24/79  bone]
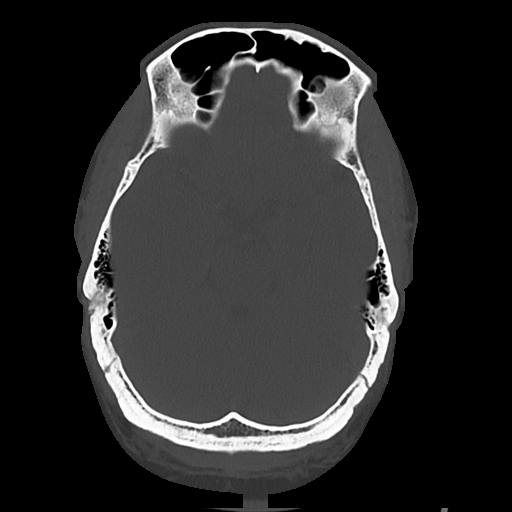

[Series 5: cor soft · coronal · 0.30mm/px · 3 of 69 slices shown]
[im 23/69  brain]
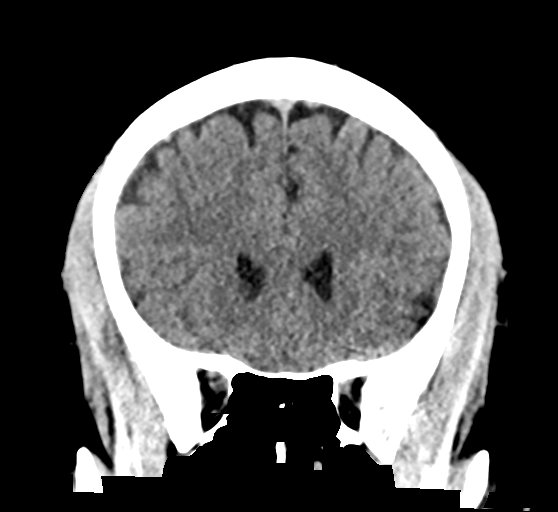
[im 31/69  brain]
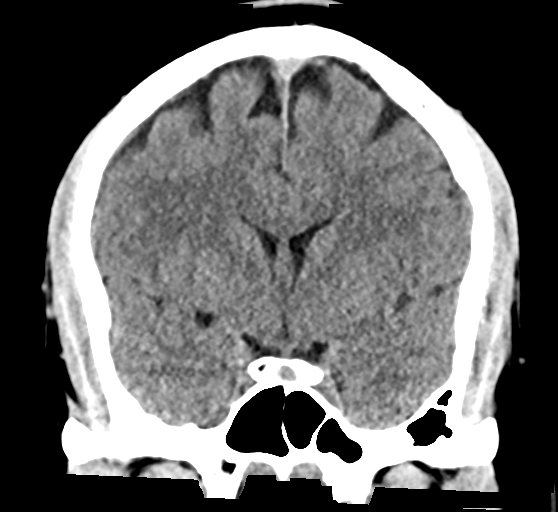
[im 38/69  brain]
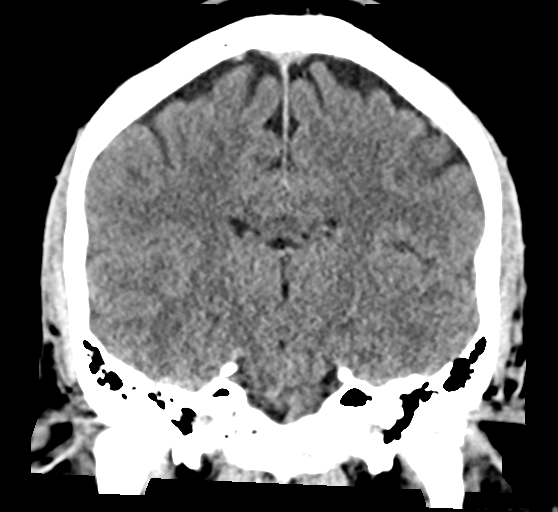

[Series 6: sag soft · sagittal · 0.31mm/px · 3 of 57 slices shown]
[im 19/57  brain]
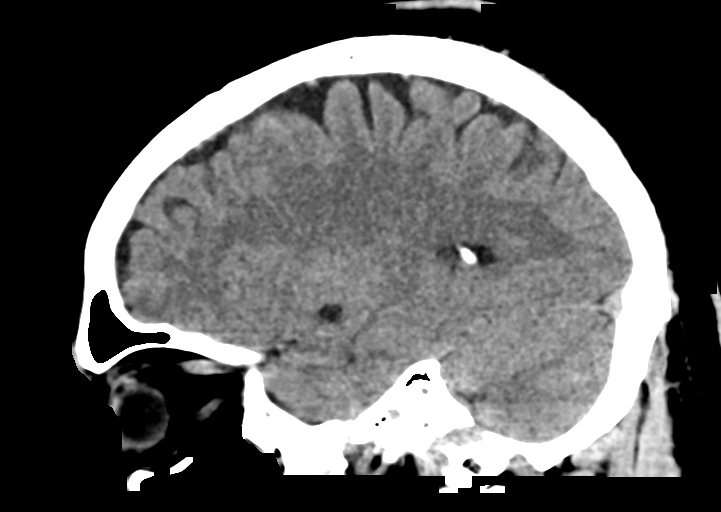
[im 29/57  brain]
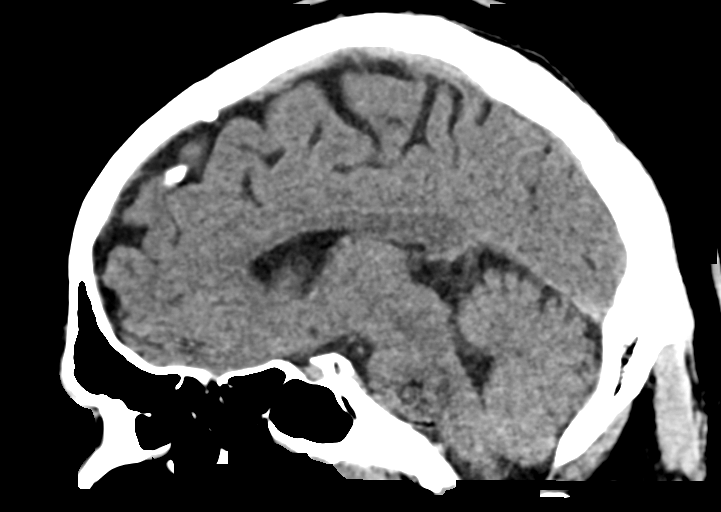
[im 38/57  brain]
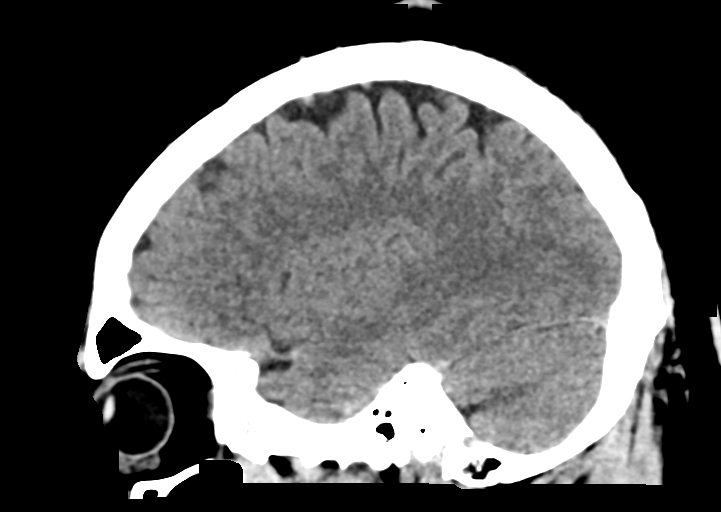

[16 of 47 positions shown; findings below may reference images not displayed]

FINDINGS: Brain: There is a prominent perivascular space in the right basal
ganglia. No intracranial hemorrhage, mass effect, or midline shift.
No hydrocephalus. The basilar cisterns are patent. No evidence of
territorial infarct or acute ischemia. No extra-axial or
intracranial fluid collection.

Vascular: No hyperdense vessel or unexpected calcification.

Skull: No fracture or focal lesion.

Sinuses/Orbits: Paranasal sinuses and mastoid air cells are clear.
The visualized orbits are unremarkable.

Other: None.
IMPRESSION: Negative noncontrast head CT.
